# Patient Record
Sex: Female | Born: 1950 | Hispanic: Refuse to answer | Marital: Married | Smoking: Never smoker
Health system: Southern US, Community
[De-identification: ages and names within clinical notes are randomized; demographics above are authoritative.]

## PROBLEM LIST (undated history)

## (undated) DIAGNOSIS — T4145XA Adverse effect of unspecified anesthetic, initial encounter: Secondary | ICD-10-CM

## (undated) DIAGNOSIS — J45909 Unspecified asthma, uncomplicated: Secondary | ICD-10-CM

## (undated) DIAGNOSIS — T8859XA Other complications of anesthesia, initial encounter: Secondary | ICD-10-CM

## (undated) DIAGNOSIS — E669 Obesity, unspecified: Secondary | ICD-10-CM

## (undated) DIAGNOSIS — E7800 Pure hypercholesterolemia, unspecified: Secondary | ICD-10-CM

## (undated) DIAGNOSIS — M109 Gout, unspecified: Secondary | ICD-10-CM

## (undated) DIAGNOSIS — F419 Anxiety disorder, unspecified: Secondary | ICD-10-CM

## (undated) HISTORY — PX: TONSILLECTOMY: SUR1361

---

## 2005-03-27 HISTORY — PX: HERNIA REPAIR: SHX51

## 2009-12-08 HISTORY — PX: ABDOMINAL HYSTERECTOMY: SHX81

## 2014-06-26 HISTORY — PX: CHOLECYSTECTOMY: SHX55

## 2017-11-21 ENCOUNTER — Encounter: Payer: Self-pay | Admitting: *Deleted

## 2017-11-22 ENCOUNTER — Ambulatory Visit: Payer: Medicare Other | Admitting: Anesthesiology

## 2017-11-22 ENCOUNTER — Encounter
Admission: RE | Disposition: A | Discharge status: Home / Self Care | Payer: Self-pay | Source: Ambulatory Visit | Attending: Internal Medicine

## 2017-11-22 ENCOUNTER — Encounter: Payer: Self-pay | Admitting: Anesthesiology

## 2017-11-22 ENCOUNTER — Ambulatory Visit
Admission: RE | Admit: 2017-11-22 | Discharge: 2017-11-22 | Disposition: A | Discharge status: Home / Self Care | Payer: Medicare Other | Source: Ambulatory Visit | Attending: Internal Medicine | Admitting: Internal Medicine

## 2017-11-22 DIAGNOSIS — Z79899 Other long term (current) drug therapy: Secondary | ICD-10-CM | POA: Insufficient documentation

## 2017-11-22 DIAGNOSIS — E669 Obesity, unspecified: Secondary | ICD-10-CM | POA: Diagnosis not present

## 2017-11-22 DIAGNOSIS — R7303 Prediabetes: Secondary | ICD-10-CM | POA: Diagnosis not present

## 2017-11-22 DIAGNOSIS — M109 Gout, unspecified: Secondary | ICD-10-CM | POA: Diagnosis not present

## 2017-11-22 DIAGNOSIS — Z1211 Encounter for screening for malignant neoplasm of colon: Secondary | ICD-10-CM | POA: Insufficient documentation

## 2017-11-22 DIAGNOSIS — F419 Anxiety disorder, unspecified: Secondary | ICD-10-CM | POA: Diagnosis not present

## 2017-11-22 DIAGNOSIS — J45909 Unspecified asthma, uncomplicated: Secondary | ICD-10-CM | POA: Diagnosis not present

## 2017-11-22 DIAGNOSIS — R194 Change in bowel habit: Secondary | ICD-10-CM | POA: Diagnosis not present

## 2017-11-22 DIAGNOSIS — K5730 Diverticulosis of large intestine without perforation or abscess without bleeding: Secondary | ICD-10-CM | POA: Diagnosis not present

## 2017-11-22 DIAGNOSIS — I10 Essential (primary) hypertension: Secondary | ICD-10-CM | POA: Diagnosis not present

## 2017-11-22 DIAGNOSIS — K640 First degree hemorrhoids: Secondary | ICD-10-CM | POA: Insufficient documentation

## 2017-11-22 DIAGNOSIS — E7800 Pure hypercholesterolemia, unspecified: Secondary | ICD-10-CM | POA: Insufficient documentation

## 2017-11-22 DIAGNOSIS — Z7982 Long term (current) use of aspirin: Secondary | ICD-10-CM | POA: Diagnosis not present

## 2017-11-22 DIAGNOSIS — D123 Benign neoplasm of transverse colon: Secondary | ICD-10-CM | POA: Diagnosis not present

## 2017-11-22 DIAGNOSIS — Z86010 Personal history of colonic polyps: Secondary | ICD-10-CM | POA: Diagnosis not present

## 2017-11-22 HISTORY — DX: Anxiety disorder, unspecified: F41.9

## 2017-11-22 HISTORY — DX: Obesity, unspecified: E66.9

## 2017-11-22 HISTORY — DX: Pure hypercholesterolemia, unspecified: E78.00

## 2017-11-22 HISTORY — PX: COLONOSCOPY WITH PROPOFOL: SHX5780

## 2017-11-22 HISTORY — DX: Other complications of anesthesia, initial encounter: T88.59XA

## 2017-11-22 HISTORY — DX: Unspecified asthma, uncomplicated: J45.909

## 2017-11-22 HISTORY — DX: Gout, unspecified: M10.9

## 2017-11-22 HISTORY — DX: Adverse effect of unspecified anesthetic, initial encounter: T41.45XA

## 2017-11-22 SURGERY — COLONOSCOPY WITH PROPOFOL
Anesthesia: General

## 2017-11-22 MED ORDER — PROPOFOL 500 MG/50ML IV EMUL
INTRAVENOUS | Status: AC
Start: 2017-11-22 — End: ?
  Filled 2017-11-22: qty 50

## 2017-11-22 MED ORDER — LIDOCAINE HCL (PF) 2 % IJ SOLN
INTRAMUSCULAR | Status: AC
  Filled 2017-11-22: qty 10

## 2017-11-22 MED ORDER — SODIUM CHLORIDE 0.9 % IV SOLN
INTRAVENOUS | Status: DC
  Administered 2017-11-22: 1000 mL via INTRAVENOUS

## 2017-11-22 MED ORDER — LIDOCAINE HCL (PF) 1 % IJ SOLN
INTRAMUSCULAR | Status: AC
  Administered 2017-11-22: 0.3 mL via INTRADERMAL
  Filled 2017-11-22: qty 2

## 2017-11-22 MED ORDER — LIDOCAINE HCL (PF) 1 % IJ SOLN
2.0000 mL | Freq: Once | INTRAMUSCULAR | Status: AC
  Administered 2017-11-22: 0.3 mL via INTRADERMAL

## 2017-11-22 MED ORDER — PROPOFOL 500 MG/50ML IV EMUL
INTRAVENOUS | Status: DC | PRN
  Administered 2017-11-22: 125 ug/kg/min via INTRAVENOUS

## 2017-11-22 MED ORDER — PROPOFOL 10 MG/ML IV BOLUS
INTRAVENOUS | Status: DC | PRN
  Administered 2017-11-22: 100 mg via INTRAVENOUS

## 2017-11-22 MED ORDER — LIDOCAINE HCL (CARDIAC) 20 MG/ML IV SOLN
INTRAVENOUS | Status: DC | PRN
  Administered 2017-11-22: 100 mg via INTRAVENOUS

## 2017-11-22 NOTE — Op Note (Signed)
[facility] Patient Name: Diana Le Procedure Date: 11/22/2017 10:10 AM MRN: [id number] Account #: [id number] Date of Birth: 05-07-1951 Admit Type: Outpatient Age: 67 Room: [facility] ENDO ROOM 3 Gender: Female Note Status: Finalized Procedure:            Colonoscopy Indications:          High risk colon cancer surveillance: Personal history                        of colonic polyps, Incidental - Change in bowel habits Providers:            Benay Pike. Keigo Whalley MD, MD Medicines:            Propofol per Anesthesia Complications:        No immediate complications. Procedure:            Pre-Anesthesia Assessment:                       - The risks and benefits of the procedure and the                        sedation options and risks were discussed with the                        patient. All questions were answered and informed                        consent was obtained.                       - Patient identification and proposed procedure were                        verified prior to the procedure by the nurse. The                        procedure was verified in the procedure room.                       - ASA Grade Assessment: II - A patient with mild                        systemic disease.                       After obtaining informed consent, the colonoscope was                        passed under direct vision. Throughout the procedure,                        the patient's blood pressure, pulse, and oxygen                        saturations were monitored continuously. The                        Colonoscope was introduced through the anus and                        advanced to the the cecum, identified  by appendiceal                        orifice and ileocecal valve. The colonoscopy was                        performed without difficulty. The patient tolerated the                        procedure well. The bowel preparation used was. The                   ileocecal valve, appendiceal orifice, and rectum were                        photographed. Findings:      The perianal and digital rectal examinations were normal. Pertinent       negatives include normal sphincter tone and no palpable rectal lesions.      Many small and large-mouthed diverticula were found in the entire colon.       There was no evidence of diverticular bleeding.      Two sessile polyps were found in the transverse colon. The polyps were 2       to 3 mm in size. These polyps were removed with a jumbo cold forceps.       Resection and retrieval were complete.      Non-bleeding internal hemorrhoids were found during retroflexion. The       hemorrhoids were Grade I (internal hemorrhoids that do not prolapse).      The exam was otherwise without abnormality.      Normal mucosa was found in the entire colon. Biopsies for histology were       taken with a cold forceps from the entire colon for evaluation of       microscopic colitis. Impression:           - Moderate diverticulosis in the entire examined colon.                        There was no evidence of diverticular bleeding.                       - Two 2 to 3 mm polyps in the transverse colon, removed                        with a jumbo cold forceps. Resected and retrieved.                       - Non-bleeding internal hemorrhoids.                       - The examination was otherwise normal. Recommendation:       - Patient has a contact number available for                        emergencies. The signs and symptoms of potential                        delayed complications were discussed with the patient.  Return to normal activities tomorrow. Written discharge                        instructions were provided to the patient.                       - Resume previous diet.                       - Continue present medications.                       - Await pathology results.                        - Repeat colonoscopy for surveillance based on                        pathology results.                       - Return to GI office in 3 months.                       - The findings and recommendations were discussed with                        the patient and their spouse. Procedure Code(s):    --- Professional ---                       [phone], Colonoscopy, flexible; with biopsy, single or                        multiple Diagnosis Code(s):    --- Professional ---                       Z86.010, Personal history of colonic polyps                       K64.0, First degree hemorrhoids                       D12.3, Benign neoplasm of transverse colon (hepatic                        flexure or splenic flexure)                       K57.30, Diverticulosis of large intestine without                        perforation or abscess without bleeding CPT copyright 2016 [employer]. All rights reserved. The codes documented in this report are preliminary and upon coder review may  be revised to meet current compliance requirements. Efrain Sella MD, MD 11/22/2017 10:57:43 AM This report has been signed electronically. Number of Addenda: 0 Note Initiated On: 11/22/2017 10:10 AM Scope Withdrawal Time: 0 hours 6 minutes 19 seconds  Total Procedure Duration: 0 hours 12 minutes 21 seconds       [facility]

## 2017-11-22 NOTE — Transfer of Care (Signed)
Immediate Anesthesia Transfer of Care Note  Patient: Diana Le  Procedure(s) Performed: COLONOSCOPY WITH PROPOFOL (N/A )  Patient Location: Endoscopy Unit  Anesthesia Type:General  Level of Consciousness: sedated  Airway & Oxygen Therapy: Patient Spontanous Breathing and Patient connected to nasal cannula oxygen  Post-op Assessment: Report given to RN and Post -op Vital signs reviewed and stable  Post vital signs: Reviewed and stable  Last Vitals:  Vitals:   11/22/17 1116 11/22/17 1126  BP: 114/64 (!) 108/91  Pulse: 65 70  Resp: 19 20  Temp:    SpO2: 98% 97%    Last Pain:  Vitals:   11/22/17 1057  TempSrc: Tympanic         Complications: No apparent anesthesia complications

## 2017-11-22 NOTE — Anesthesia Post-op Follow-up Note (Signed)
Anesthesia QCDR form completed.        

## 2017-11-22 NOTE — Interval H&P Note (Signed)
History and Physical Interval Note:  11/22/2017 10:35 AM  Diana Le  has presented today for surgery, with the diagnosis of ALTERED BOWEL HABITS,HX.OF COLON POLYPS  The various methods of treatment have been discussed with the patient and family. After consideration of risks, benefits and other options for treatment, the patient has consented to  Procedure(s): COLONOSCOPY WITH PROPOFOL (N/A) as a surgical intervention .  The patient's history has been reviewed, patient examined, no change in status, stable for surgery.  I have reviewed the patient's chart and labs.  Questions were answered to the patient's satisfaction.     [place], [place]

## 2017-11-22 NOTE — H&P (Signed)
Outpatient short stay form Pre-procedure 11/22/2017 10:33 AM Diana Le K. Alice Reichert, M.D.  Primary Physician: Arrie Aran, DO  Reason for visit: Change in bowel habits, Diarrhea, personal hx of colon polyps.  History of present illness:  Patient is a pleasant 67 y/o female with personal hx of colon polyps with loose stools since cholecystectomy in 2015. Denies rectal bleeding.     Current Facility-Administered Medications:  .  0.9 %  sodium chloride infusion, , Intravenous, Continuous, [place], Benay Pike, MD, Last Rate: 20 mL/hr at 11/22/17 0920, 1,000 mL at 11/22/17 0920  Medications Prior to Admission  Medication Sig Dispense Refill Last Dose  . aspirin 81 MG chewable tablet Chew 81 mg by mouth daily.   11/18/2017  . Cholecalciferol 1000 units tablet Take 1,000 Units by mouth daily.     . Cyanocobalamin 1000 MCG/ML KIT Inject 1,000 mcg as directed every 30 (thirty) days.     Marland Kitchen lovastatin (MEVACOR) 20 MG tablet Take 20 mg by mouth daily with supper.     . sertraline (ZOLOFT) 25 MG tablet Take 25 mg by mouth at bedtime.        No Known Allergies   Past Medical History:  Diagnosis Date  . Anxiety   . Asthma   . Complication of anesthesia    slow to awaken, HR decreased after hernia surgery 03/27/05  . Gout   . Hypercholesteremia   . Obesity     Review of systems:      Physical Exam  General appearance: alert, cooperative and appears stated age Resp: clear to auscultation bilaterally Cardio: regular rate and rhythm, S1, S2 normal, no murmur, click, rub or gallop GI: soft, non-tender; bowel sounds normal; no masses,  no organomegaly     Planned procedures: Colonoscopy. The patient understands the nature of the planned procedure, indications, risks, alternatives and potential complications including but not limited to bleeding, infection, perforation, damage to internal organs and possible oversedation/side effects from anesthesia. The patient agrees and gives consent to  proceed.  Please refer to procedure notes for findings, recommendations and patient disposition/instructions.    Diana Le K. Alice Reichert, M.D. Gastroenterology 11/22/2017  10:33 AM

## 2017-11-22 NOTE — Anesthesia Preprocedure Evaluation (Signed)
Anesthesia Evaluation  Patient identified by MRN, date of birth, ID band Patient awake    Reviewed: Allergy & Precautions, H&P , NPO status , Patient's Chart, lab work & pertinent test results, reviewed documented beta blocker date and time   History of Anesthesia Complications (+) PROLONGED EMERGENCE and history of anesthetic complications  Airway Mallampati: II  TM Distance: >3 FB Neck ROM: full    Dental  (+) Dental Advidsory Given, Teeth Intact   Pulmonary neg shortness of breath, asthma , sleep apnea (likely based on history, but never tested) , neg COPD, neg recent URI,           Cardiovascular Exercise Tolerance: Good hypertension, (-) angina(-) CAD, (-) Past MI, (-) Cardiac Stents and (-) CABG + dysrhythmias (-) Valvular Problems/Murmurs     Neuro/Psych negative neurological ROS  negative psych ROS   GI/Hepatic negative GI ROS, Neg liver ROS,   Endo/Other  diabetes (borderline)  Renal/GU negative Renal ROS  negative genitourinary   Musculoskeletal   Abdominal   Peds  Hematology negative hematology ROS (+)   Anesthesia Other Findings Past Medical History: No date: Anxiety No date: Asthma No date: Complication of anesthesia     Comment:  slow to awaken, HR decreased after hernia surgery               03/27/05 No date: Gout No date: Hypercholesteremia No date: Obesity   Reproductive/Obstetrics negative OB ROS                             Anesthesia Physical Anesthesia Plan  ASA: II  Anesthesia Plan: General   Post-op Pain Management:    Induction: Intravenous  PONV Risk Score and Plan: 3 and Propofol infusion  Airway Management Planned: Nasal Cannula  Additional Equipment:   Intra-op Plan:   Post-operative Plan:   Informed Consent: I have reviewed the patients History and Physical, chart, labs and discussed the procedure including the risks, benefits and  alternatives for the proposed anesthesia with the patient or authorized representative who has indicated his/her understanding and acceptance.   Dental Advisory Given  Plan Discussed with: Anesthesiologist, CRNA and Surgeon  Anesthesia Plan Comments:         Anesthesia Quick Evaluation

## 2017-11-22 NOTE — Anesthesia Postprocedure Evaluation (Signed)
Anesthesia Post Note  Patient: [place]  Procedure(s) Performed: COLONOSCOPY WITH PROPOFOL (N/A )  Patient location during evaluation: Endoscopy Anesthesia Type: General Level of consciousness: awake and alert Pain management: pain level controlled Vital Signs Assessment: post-procedure vital signs reviewed and stable Respiratory status: spontaneous breathing, nonlabored ventilation, respiratory function stable and patient connected to nasal cannula oxygen Cardiovascular status: blood pressure returned to baseline and stable Postop Assessment: no apparent nausea or vomiting Anesthetic complications: no     Last Vitals:  Vitals:   11/22/17 1116 11/22/17 1126  BP: 114/64 (!) 108/91  Pulse: 65 70  Resp: 19 20  Temp:    SpO2: 98% 97%    Last Pain:  Vitals:   11/22/17 1057  TempSrc: Tympanic                 Martha Clan

## 2017-11-23 ENCOUNTER — Encounter: Payer: Self-pay | Admitting: Internal Medicine

## 2017-11-24 LAB — SURGICAL PATHOLOGY

## 2018-01-31 ENCOUNTER — Other Ambulatory Visit: Payer: Self-pay | Admitting: Family Medicine

## 2018-01-31 DIAGNOSIS — Z1231 Encounter for screening mammogram for malignant neoplasm of breast: Secondary | ICD-10-CM

## 2018-02-05 ENCOUNTER — Ambulatory Visit
Admission: RE | Admit: 2018-02-05 | Discharge: 2018-02-05 | Disposition: A | Discharge status: Home / Self Care | Payer: Medicare Other | Source: Ambulatory Visit | Attending: Family Medicine | Admitting: Family Medicine

## 2018-02-05 ENCOUNTER — Encounter (INDEPENDENT_AMBULATORY_CARE_PROVIDER_SITE_OTHER): Payer: Self-pay

## 2018-02-05 DIAGNOSIS — Z1231 Encounter for screening mammogram for malignant neoplasm of breast: Secondary | ICD-10-CM

## 2019-03-28 ENCOUNTER — Other Ambulatory Visit: Payer: Self-pay | Admitting: Family Medicine

## 2019-03-28 DIAGNOSIS — Z1231 Encounter for screening mammogram for malignant neoplasm of breast: Secondary | ICD-10-CM

## 2019-04-23 ENCOUNTER — Ambulatory Visit
Admission: RE | Admit: 2019-04-23 | Discharge: 2019-04-23 | Disposition: A | Discharge status: Home / Self Care | Payer: Medicare Other | Source: Ambulatory Visit | Attending: Family Medicine | Admitting: Family Medicine

## 2019-04-23 ENCOUNTER — Other Ambulatory Visit: Payer: Self-pay

## 2019-04-23 DIAGNOSIS — Z1231 Encounter for screening mammogram for malignant neoplasm of breast: Secondary | ICD-10-CM | POA: Insufficient documentation

## 2019-09-23 ENCOUNTER — Other Ambulatory Visit: Payer: Self-pay | Admitting: Family Medicine

## 2019-09-23 ENCOUNTER — Ambulatory Visit: Admission: RE | Admit: 2019-09-23 | Payer: Medicare Other | Source: Ambulatory Visit | Admitting: *Deleted

## 2019-09-23 ENCOUNTER — Ambulatory Visit
Admission: RE | Admit: 2019-09-23 | Discharge: 2019-09-23 | Disposition: A | Discharge status: Home / Self Care | Payer: Medicare Other | Source: Ambulatory Visit | Attending: Family Medicine | Admitting: Family Medicine

## 2019-09-23 ENCOUNTER — Other Ambulatory Visit: Payer: Self-pay

## 2019-09-23 DIAGNOSIS — R109 Unspecified abdominal pain: Secondary | ICD-10-CM

## 2019-09-24 ENCOUNTER — Other Ambulatory Visit: Payer: Self-pay | Admitting: Family Medicine

## 2020-01-14 ENCOUNTER — Other Ambulatory Visit: Payer: Self-pay | Admitting: Family Medicine

## 2020-01-14 DIAGNOSIS — Z1231 Encounter for screening mammogram for malignant neoplasm of breast: Secondary | ICD-10-CM

## 2020-04-23 ENCOUNTER — Ambulatory Visit
Admission: RE | Admit: 2020-04-23 | Discharge: 2020-04-23 | Disposition: A | Discharge status: Home / Self Care | Payer: Medicare Other | Source: Ambulatory Visit | Attending: Family Medicine | Admitting: Family Medicine

## 2020-04-23 ENCOUNTER — Other Ambulatory Visit: Payer: Self-pay

## 2020-04-23 DIAGNOSIS — Z1231 Encounter for screening mammogram for malignant neoplasm of breast: Secondary | ICD-10-CM | POA: Insufficient documentation

## 2021-03-29 ENCOUNTER — Other Ambulatory Visit: Payer: Self-pay | Admitting: Family Medicine

## 2021-03-29 DIAGNOSIS — Z1231 Encounter for screening mammogram for malignant neoplasm of breast: Secondary | ICD-10-CM

## 2021-05-12 ENCOUNTER — Ambulatory Visit
Admission: RE | Admit: 2021-05-12 | Discharge: 2021-05-12 | Disposition: A | Discharge status: Home / Self Care | Payer: Medicare Other | Source: Ambulatory Visit | Attending: Family Medicine | Admitting: Family Medicine

## 2021-05-12 ENCOUNTER — Other Ambulatory Visit: Payer: Self-pay

## 2021-05-12 DIAGNOSIS — Z1231 Encounter for screening mammogram for malignant neoplasm of breast: Secondary | ICD-10-CM

## 2021-07-21 IMAGING — MG MM DIGITAL SCREENING BILAT W/ TOMO AND CAD
8 series · 8 of 24 positions shown · non-contrast
Comparison: Previous exam(s).

ACR Breast Density Category a: The breast tissue is almost entirely
fatty.

CLINICAL DATA: Screening.

EXAM:
DIGITAL SCREENING BILATERAL MAMMOGRAM WITH TOMOSYNTHESIS AND CAD
TECHNIQUE: Bilateral screening digital craniocaudal and mediolateral oblique
mammograms were obtained. Bilateral screening digital breast
tomosynthesis was performed. The images were evaluated with
computer-aided detection.

[R CC synth-2D]
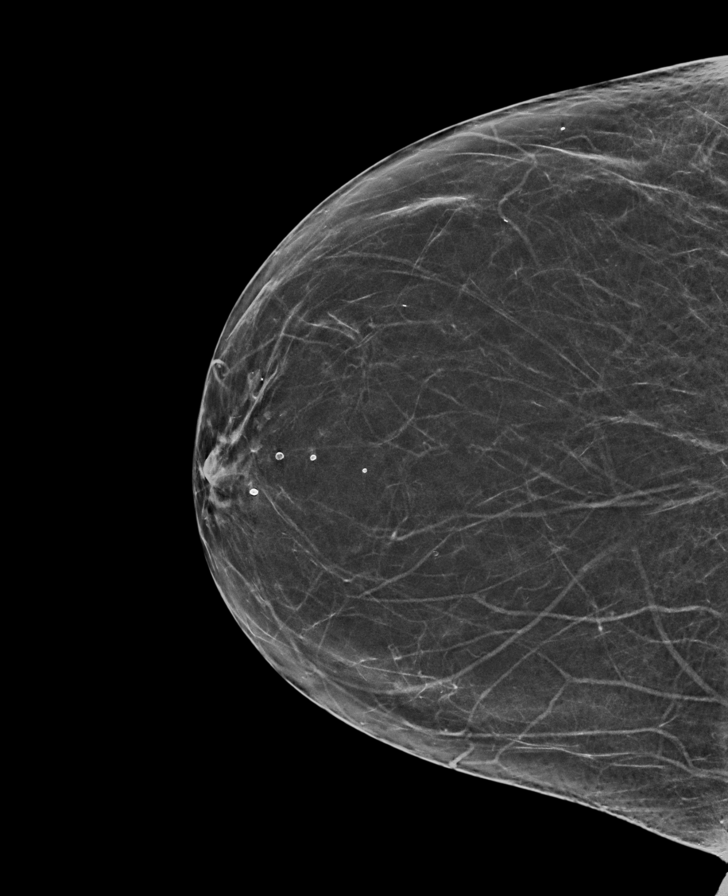

[L MLO synth-2D]
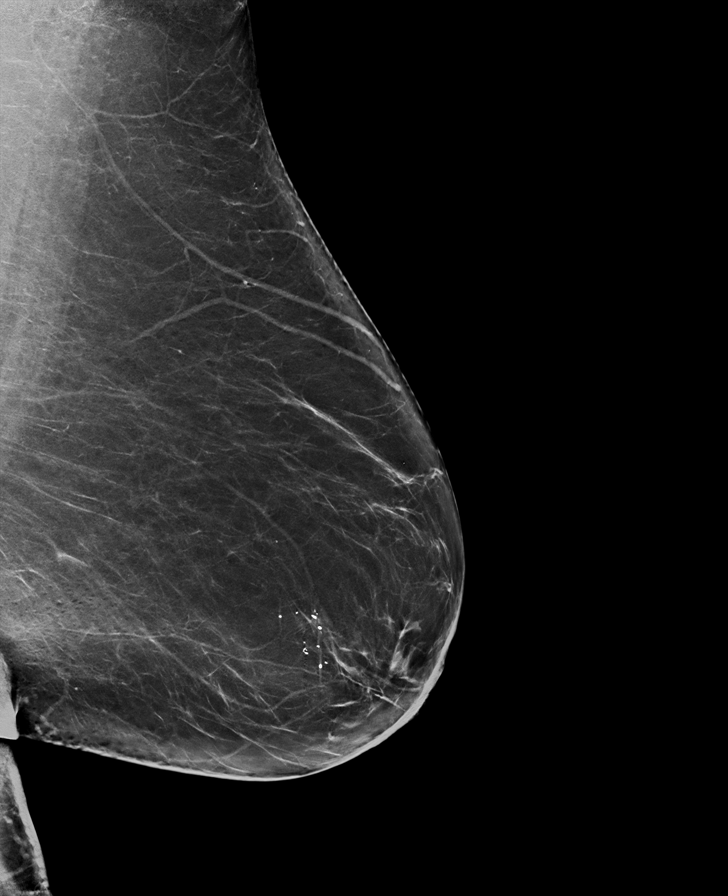

[R MLO synth-2D]
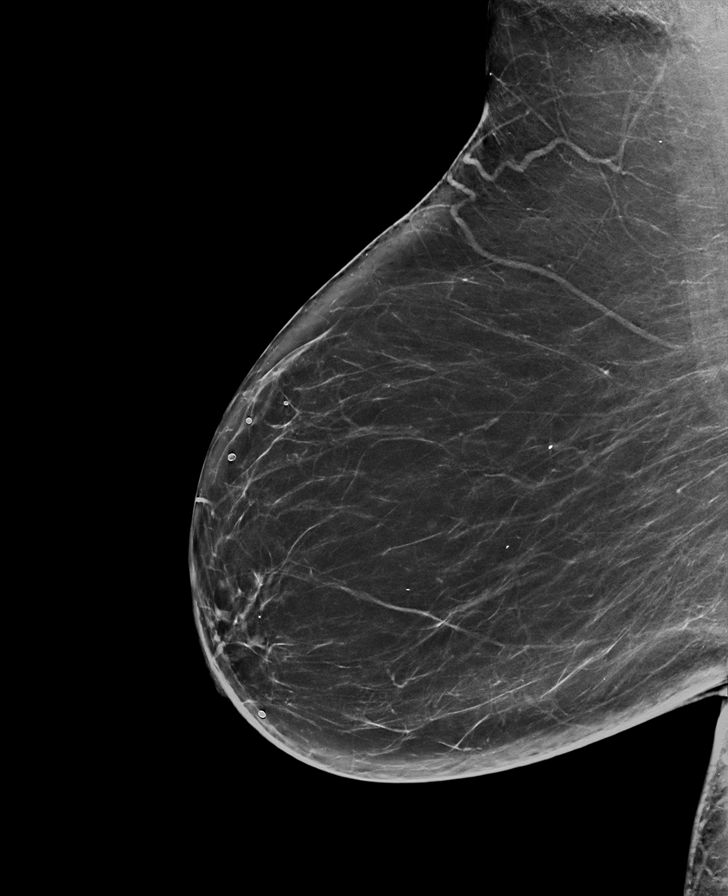

[L CC synth-2D]
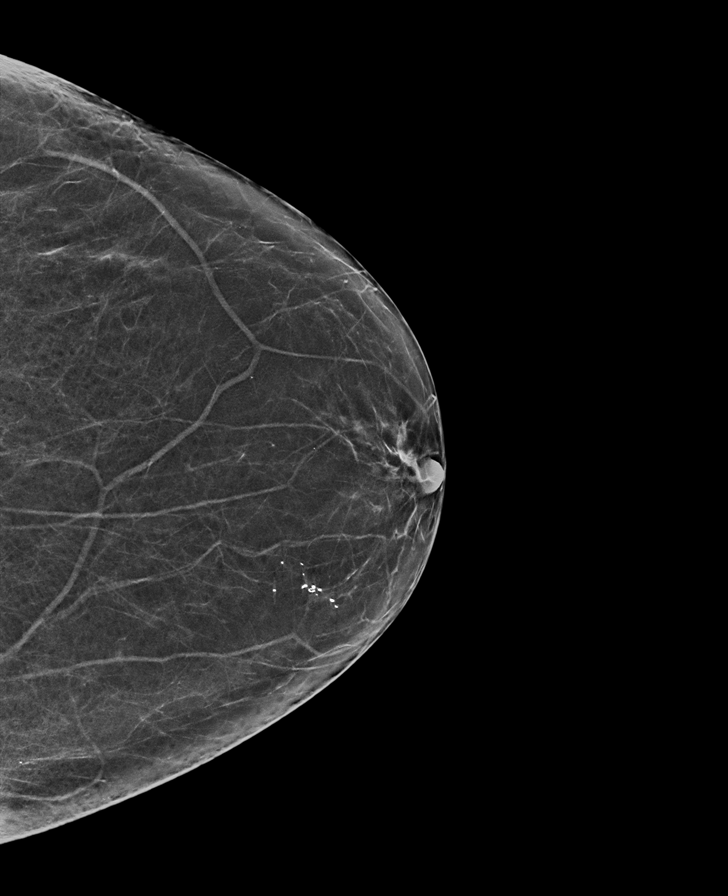

[R CC tomo · tomo slice 31/60.0]
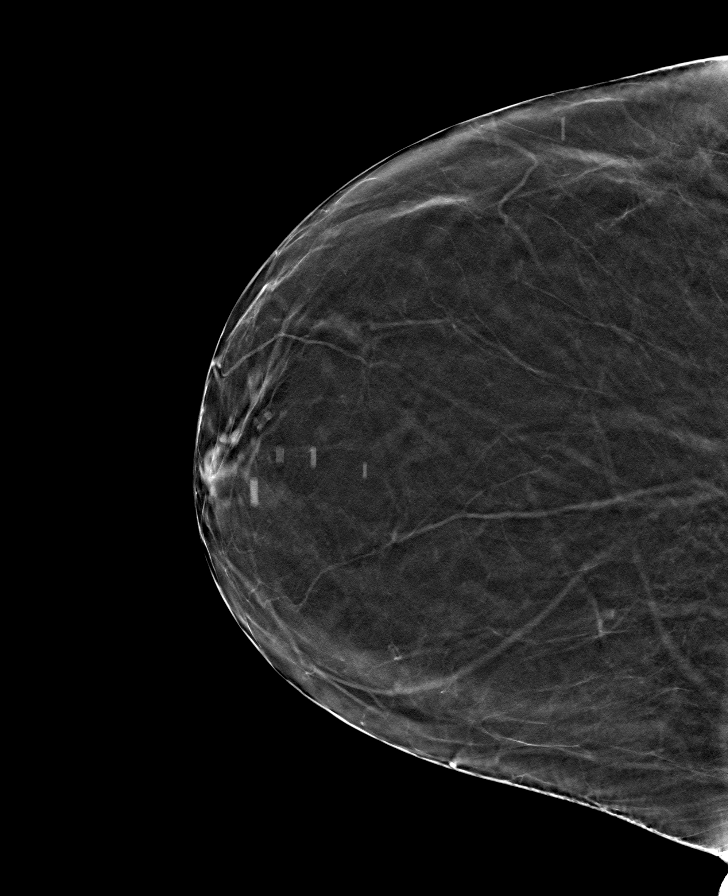

[L CC tomo · tomo slice 31/62.0]
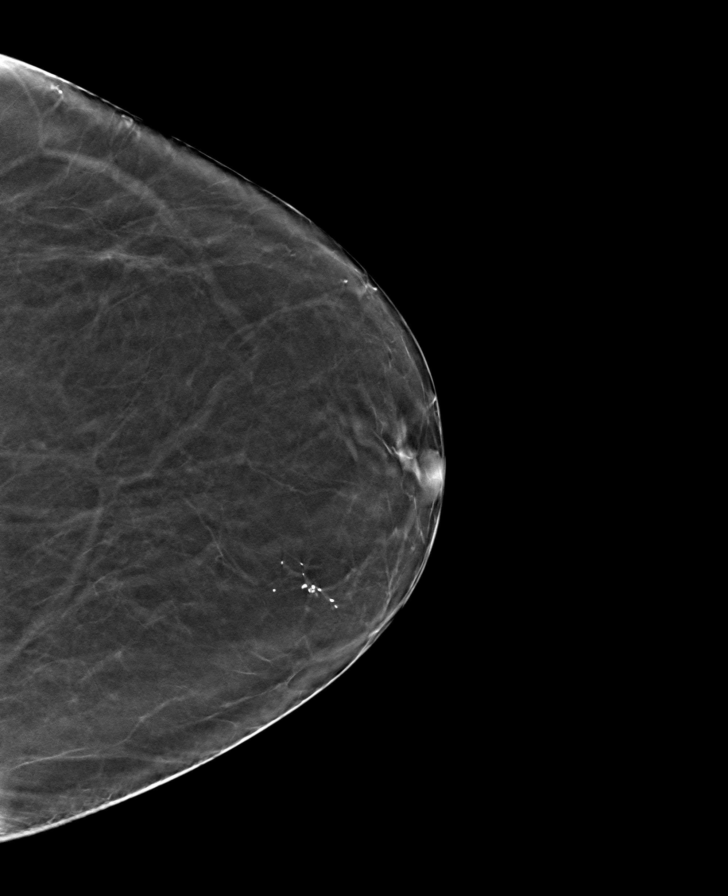

[L MLO tomo · tomo slice 42/83.0]
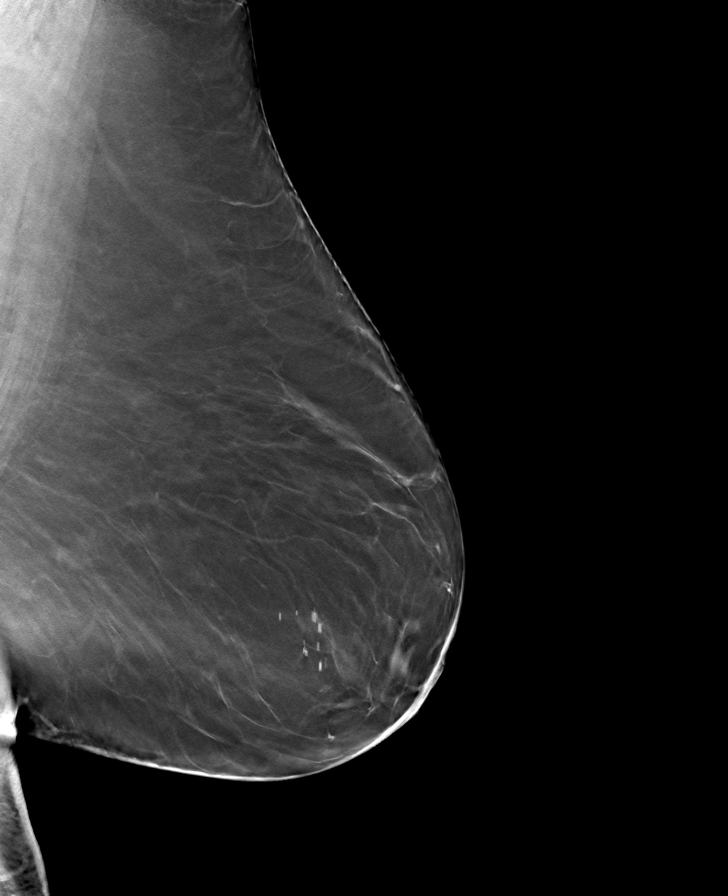

[R MLO tomo · tomo slice 40/79.0]
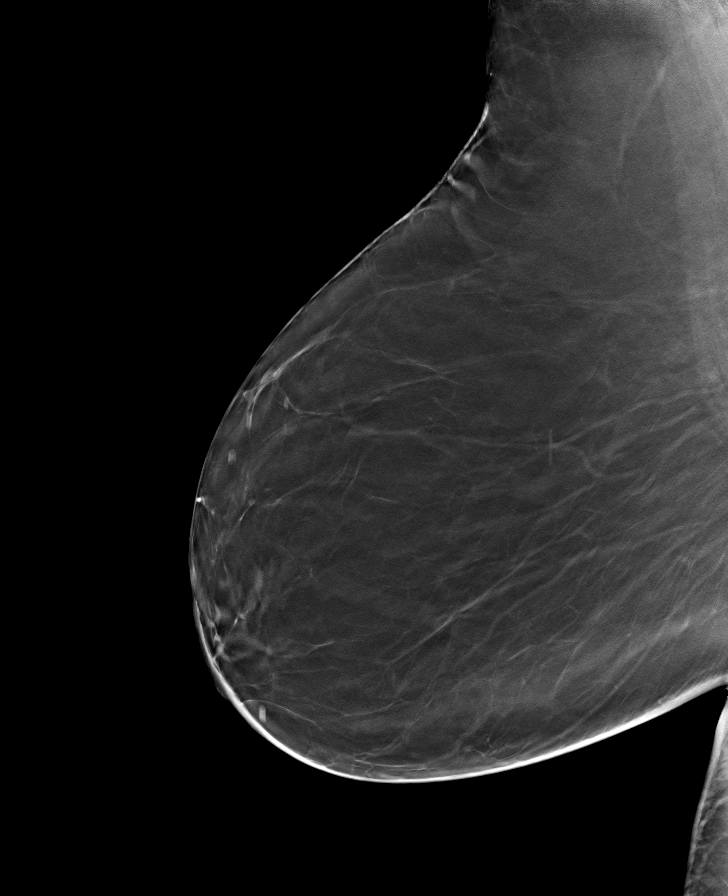

[8 of 24 positions shown; findings below may reference images not displayed]

FINDINGS: There are no findings suspicious for malignancy.
IMPRESSION: No mammographic evidence of malignancy. A result letter of this
screening mammogram will be mailed directly to the patient.

RECOMMENDATION:
Screening mammogram in one year. (Code:0E-3-N98)

BI-RADS CATEGORY  1: Negative.

## 2021-08-06 ENCOUNTER — Other Ambulatory Visit: Payer: Self-pay | Admitting: Family Medicine

## 2021-08-06 DIAGNOSIS — Z780 Asymptomatic menopausal state: Secondary | ICD-10-CM

## 2022-04-06 ENCOUNTER — Other Ambulatory Visit: Payer: Self-pay | Admitting: Family Medicine

## 2022-04-06 DIAGNOSIS — Z1231 Encounter for screening mammogram for malignant neoplasm of breast: Secondary | ICD-10-CM

## 2022-05-31 ENCOUNTER — Ambulatory Visit
Admission: RE | Admit: 2022-05-31 | Discharge: 2022-05-31 | Disposition: A | Discharge status: Home / Self Care | Payer: Medicare Other | Source: Ambulatory Visit | Attending: Family Medicine | Admitting: Family Medicine

## 2022-05-31 DIAGNOSIS — Z780 Asymptomatic menopausal state: Secondary | ICD-10-CM | POA: Insufficient documentation

## 2022-05-31 DIAGNOSIS — Z1231 Encounter for screening mammogram for malignant neoplasm of breast: Secondary | ICD-10-CM

## 2023-04-24 ENCOUNTER — Ambulatory Visit: Payer: Medicare Other

## 2023-04-24 DIAGNOSIS — K5730 Diverticulosis of large intestine without perforation or abscess without bleeding: Secondary | ICD-10-CM | POA: Diagnosis not present

## 2023-04-24 DIAGNOSIS — K640 First degree hemorrhoids: Secondary | ICD-10-CM

## 2023-04-24 DIAGNOSIS — Z86010 Personal history of colonic polyps: Secondary | ICD-10-CM | POA: Diagnosis not present

## 2023-04-24 DIAGNOSIS — D120 Benign neoplasm of cecum: Secondary | ICD-10-CM | POA: Diagnosis not present

## 2023-04-24 DIAGNOSIS — Z1211 Encounter for screening for malignant neoplasm of colon: Secondary | ICD-10-CM | POA: Diagnosis not present

## 2023-07-03 ENCOUNTER — Other Ambulatory Visit: Payer: Self-pay | Admitting: Family Medicine

## 2023-07-03 DIAGNOSIS — Z1231 Encounter for screening mammogram for malignant neoplasm of breast: Secondary | ICD-10-CM

## 2023-07-31 ENCOUNTER — Ambulatory Visit
Admission: RE | Admit: 2023-07-31 | Discharge: 2023-07-31 | Disposition: A | Discharge status: Home / Self Care | Payer: Medicare Other | Source: Ambulatory Visit | Attending: Family Medicine | Admitting: Family Medicine

## 2023-07-31 DIAGNOSIS — Z1231 Encounter for screening mammogram for malignant neoplasm of breast: Secondary | ICD-10-CM | POA: Insufficient documentation

## 2024-08-30 ENCOUNTER — Other Ambulatory Visit: Payer: Self-pay | Admitting: Family Medicine

## 2024-08-30 DIAGNOSIS — Z1231 Encounter for screening mammogram for malignant neoplasm of breast: Secondary | ICD-10-CM

## 2024-10-14 ENCOUNTER — Ambulatory Visit
Admission: RE | Admit: 2024-10-14 | Discharge: 2024-10-14 | Disposition: A | Source: Ambulatory Visit | Attending: Family Medicine | Admitting: Family Medicine

## 2024-10-14 DIAGNOSIS — Z1231 Encounter for screening mammogram for malignant neoplasm of breast: Secondary | ICD-10-CM

## 2024-10-24 ENCOUNTER — Other Ambulatory Visit: Payer: Self-pay | Admitting: Family Medicine

## 2024-10-24 DIAGNOSIS — R928 Other abnormal and inconclusive findings on diagnostic imaging of breast: Secondary | ICD-10-CM

## 2024-10-29 ENCOUNTER — Ambulatory Visit
Admission: RE | Admit: 2024-10-29 | Discharge: 2024-10-29 | Disposition: A | Discharge status: Home / Self Care | Source: Ambulatory Visit | Attending: Family Medicine | Admitting: Family Medicine

## 2024-10-29 DIAGNOSIS — R928 Other abnormal and inconclusive findings on diagnostic imaging of breast: Secondary | ICD-10-CM | POA: Diagnosis present

## 2024-11-15 ENCOUNTER — Other Ambulatory Visit: Payer: Self-pay | Admitting: Family Medicine

## 2024-11-15 DIAGNOSIS — N6489 Other specified disorders of breast: Secondary | ICD-10-CM

## 2025-01-28 ENCOUNTER — Other Ambulatory Visit

## 2025-01-28 ENCOUNTER — Encounter
